# Patient Record
Sex: Male | Born: 1975 | Race: Black or African American | Hispanic: No | Smoking: Light tobacco smoker
Health system: Southern US, Community
[De-identification: ages and names within clinical notes are randomized; demographics above are authoritative.]

## PROBLEM LIST (undated history)

## (undated) DIAGNOSIS — I10 Essential (primary) hypertension: Secondary | ICD-10-CM

## (undated) HISTORY — PX: HERNIA REPAIR: SHX51

---

## 2010-05-26 ENCOUNTER — Emergency Department (HOSPITAL_COMMUNITY): Admission: EM | Admit: 2010-05-26 | Discharge: 2010-05-26 | Payer: Self-pay | Admitting: Emergency Medicine

## 2013-08-09 ENCOUNTER — Encounter (HOSPITAL_COMMUNITY): Payer: Self-pay | Admitting: Emergency Medicine

## 2013-08-09 ENCOUNTER — Emergency Department (HOSPITAL_COMMUNITY)
Admission: EM | Admit: 2013-08-09 | Discharge: 2013-08-09 | Disposition: A | Discharge status: Home / Self Care | Payer: Non-veteran care | Attending: Emergency Medicine | Admitting: Emergency Medicine

## 2013-08-09 ENCOUNTER — Emergency Department (HOSPITAL_COMMUNITY): Payer: Non-veteran care

## 2013-08-09 DIAGNOSIS — W2209XA Striking against other stationary object, initial encounter: Secondary | ICD-10-CM | POA: Insufficient documentation

## 2013-08-09 DIAGNOSIS — F17200 Nicotine dependence, unspecified, uncomplicated: Secondary | ICD-10-CM | POA: Insufficient documentation

## 2013-08-09 DIAGNOSIS — I10 Essential (primary) hypertension: Secondary | ICD-10-CM | POA: Insufficient documentation

## 2013-08-09 DIAGNOSIS — Y9389 Activity, other specified: Secondary | ICD-10-CM | POA: Insufficient documentation

## 2013-08-09 DIAGNOSIS — S62309A Unspecified fracture of unspecified metacarpal bone, initial encounter for closed fracture: Secondary | ICD-10-CM

## 2013-08-09 DIAGNOSIS — Y929 Unspecified place or not applicable: Secondary | ICD-10-CM | POA: Insufficient documentation

## 2013-08-09 HISTORY — DX: Essential (primary) hypertension: I10

## 2013-08-09 MED ORDER — HYDROMORPHONE HCL PF 1 MG/ML IJ SOLN
1.0000 mg | Freq: Once | INTRAMUSCULAR | Status: AC
  Administered 2013-08-09: 1 mg via INTRAMUSCULAR
  Filled 2013-08-09: qty 1

## 2013-08-09 MED ORDER — HYDROCODONE-ACETAMINOPHEN 5-325 MG PO TABS: 1.0000 | ORAL_TABLET | ORAL | Status: AC | PRN

## 2013-08-09 NOTE — ED Provider Notes (Signed)
CSN: [id number]     Arrival date & time 08/09/13  1253 History   First MD Initiated Contact with Patient 08/09/13 1541     Chief Complaint  Patient presents with  . Hand Injury   (Consider location/radiation/quality/duration/timing/severity/associated sxs/prior Treatment) HPI Comments: John Ross is a 37 y.o. Male presenting with pain and deformity of the 4th and 5th mcp joints after punching a wall early this morning.  He denies numbness in the fingers and is able to move them freely with moderate discomfort.  He has taken no medicines prior to arrival, but has kept it elevated without significant improvement.  He denies other injury including no forearm or elbow pain.     The history is provided by the patient.    Past Medical History  Diagnosis Date  . Hypertension    Past Surgical History  Procedure Laterality Date  . Hernia repair     History reviewed. No pertinent family history. History  Substance Use Topics  . Smoking status: Current Every Day Smoker  . Smokeless tobacco: Not on file  . Alcohol Use: Yes    Review of Systems  Constitutional: Negative for fever.  Musculoskeletal: Positive for arthralgias and joint swelling. Negative for myalgias.  Neurological: Negative for weakness and numbness.    Allergies  Review of patient's allergies indicates no known allergies.  Home Medications   Current Outpatient Rx  Name  Route  Sig  Dispense  Refill  . HYDROcodone-acetaminophen (NORCO/VICODIN) 5-325 MG per tablet   Oral   Take 1 tablet by mouth every 4 (four) hours as needed for moderate pain.   20 tablet   0    BP 175/108  Pulse 79  Temp(Src) 98.4 F (36.9 C) (Oral)  Resp 20  Ht 5\' 7"  (1.702 m)  Wt 179 lb (81.194 kg)  BMI 28.03 kg/m2  SpO2 100% Physical Exam  Constitutional: He appears well-developed and well-nourished.  HENT:  Head: Atraumatic.  Neck: Normal range of motion.  Cardiovascular:  Pulses equal bilaterally  Musculoskeletal: He  exhibits tenderness.       Hands: Ttp,  Deformity present at his fourth and fifth right MCP joints.  Distal sensation is intact, he is less than 3 second cap refill in his fingers.  Radial pulse full.  No forearm or elbow pain.  Neurological: He is alert. He has normal strength. He displays normal reflexes. No sensory deficit.  Equal strength  Skin: Skin is warm and dry.  Psychiatric: He has a normal mood and affect.    ED Course  SPLINT APPLICATION Date/Time: 08/09/2013 4:56 PM Performed by: Burgess Amor Authorized by: Burgess Amor Consent: Verbal consent obtained. Risks and benefits: risks, benefits and alternatives were discussed Consent given by: patient Patient understanding: patient states understanding of the procedure being performed Patient identity confirmed: verbally with patient Location details: right wrist Splint type: ulnar gutter Supplies used: cotton padding and Ortho-Glass Post-procedure: The splinted body part was neurovascularly unchanged following the procedure. Patient tolerance: Patient tolerated the procedure well with no immediate complications.   (including critical care time) Labs Review Labs Reviewed - No data to display Imaging Review Dg Hand Complete Right  08/09/2013   CLINICAL DATA:  Trauma.  EXAM: RIGHT HAND - COMPLETE 3+ VIEW  COMPARISON:  None.  FINDINGS: Angulated comminuted fractures of the distal metaphysis of the right 4th and 5th metacarpals noted. No radiopaque foreign bodies. Prominent soft tissue swelling.  IMPRESSION: Angulated comminuted fractures of the distal metaphysis of the right  4th and 5th metacarpals.   Electronically Signed   By: Maisie Fus  Register   On: 08/09/2013 14:13    EKG Interpretation   None       MDM   1. Metacarpal bone fracture, closed, initial encounter    Patient was referred to Dr. Romeo Apple for further management.  Encouraged ice, elevation.  Prescribed hydrocodone for pain relief.    Burgess Amor,  PA-C 08/09/13 [phone]

## 2013-08-09 NOTE — ED Notes (Addendum)
Rt hand injury ,struck wall with fist. Out of bp pills for 5 days

## 2013-08-10 NOTE — ED Provider Notes (Signed)
Medical screening examination/treatment/procedure(s) were performed by non-physician practitioner and as supervising physician I was immediately available for consultation/collaboration.  EKG Interpretation   None       Devoria Albe, MD, Armando Gang   Ward Givens, MD 08/10/13 [phone]

## 2015-11-14 ENCOUNTER — Emergency Department (HOSPITAL_COMMUNITY)
Admission: EM | Admit: 2015-11-14 | Discharge: 2015-11-14 | Disposition: A | Discharge status: Home / Self Care | Payer: Non-veteran care | Attending: Emergency Medicine | Admitting: Emergency Medicine

## 2015-11-14 ENCOUNTER — Encounter (HOSPITAL_COMMUNITY): Payer: Self-pay | Admitting: Emergency Medicine

## 2015-11-14 ENCOUNTER — Emergency Department (HOSPITAL_COMMUNITY): Payer: Non-veteran care

## 2015-11-14 DIAGNOSIS — S99921A Unspecified injury of right foot, initial encounter: Secondary | ICD-10-CM | POA: Diagnosis present

## 2015-11-14 DIAGNOSIS — S99911A Unspecified injury of right ankle, initial encounter: Secondary | ICD-10-CM | POA: Insufficient documentation

## 2015-11-14 DIAGNOSIS — Y9289 Other specified places as the place of occurrence of the external cause: Secondary | ICD-10-CM | POA: Insufficient documentation

## 2015-11-14 DIAGNOSIS — Y9389 Activity, other specified: Secondary | ICD-10-CM | POA: Diagnosis not present

## 2015-11-14 DIAGNOSIS — F17200 Nicotine dependence, unspecified, uncomplicated: Secondary | ICD-10-CM | POA: Insufficient documentation

## 2015-11-14 DIAGNOSIS — Y998 Other external cause status: Secondary | ICD-10-CM | POA: Insufficient documentation

## 2015-11-14 DIAGNOSIS — S92411A Displaced fracture of proximal phalanx of right great toe, initial encounter for closed fracture: Secondary | ICD-10-CM | POA: Insufficient documentation

## 2015-11-14 DIAGNOSIS — I10 Essential (primary) hypertension: Secondary | ICD-10-CM | POA: Insufficient documentation

## 2015-11-14 DIAGNOSIS — W208XXA Other cause of strike by thrown, projected or falling object, initial encounter: Secondary | ICD-10-CM | POA: Diagnosis not present

## 2015-11-14 DIAGNOSIS — S92401A Displaced unspecified fracture of right great toe, initial encounter for closed fracture: Secondary | ICD-10-CM

## 2015-11-14 MED ORDER — OXYCODONE-ACETAMINOPHEN 5-325 MG PO TABS: 1.0000 | ORAL_TABLET | Freq: Three times a day (TID) | ORAL | Status: AC | PRN

## 2015-11-14 MED ORDER — HYDROCHLOROTHIAZIDE 12.5 MG PO CAPS
12.5000 mg | ORAL_CAPSULE | Freq: Every day | ORAL | Status: DC
  Administered 2015-11-14: 12.5 mg via ORAL
  Filled 2015-11-14: qty 1

## 2015-11-14 NOTE — Discharge Instructions (Signed)
Please read and follow all provided instructions.  Your diagnoses today include:  1. Fracture of great toe, right, closed, initial encounter    Tests performed today include:  Vital signs. See below for your results today.   Medications prescribed:   Percocet You have been prescribed a narcotic medication on an "as needed" basis. Take only as prescribed. Do not drive, operate any machinery or make any important decisions while taking this medication as it is sedating. It may cause constipation take over the counter stool softeners or add fiber to your diet to treat this (Metamucil, Psyllium Fiber, Colace, Miralax) Further refills will need to be obtained from your primary care doctor and will not be prescribed through the Emergency Department. You will test positive on most drug tests while taking this medciation.   Home care instructions:  Follow any educational materials contained in this packet.  Follow-up instructions: Please follow-up with Dr. Eulah Pont (Orhtopedics) Call and Make an Appointment as soon as you can  Return instructions:   Please return to the Emergency Department if you do not get better, if you get worse, or new symptoms OR  - Fever (temperature greater than 101.9F)  - Bleeding that does not stop with holding pressure to the area    -Severe pain (please note that you may be more sore the day after your accident)  - Chest Pain  - Difficulty breathing  - Severe nausea or vomiting  - Inability to tolerate food and liquids  - Passing out  - Skin becoming red around your wounds  - Change in mental status (confusion or lethargy)  - New numbness or weakness     Please return if you have any other emergent concerns.  Additional Information:  Your vital signs today were: BP 176/116 mmHg   Pulse 83   Temp(Src) 98.6 F (37 C) (Oral)   Resp 14   Ht  (1.702 m)   Wt 81.194 kg   BMI 28.03 kg/m2   SpO2 99% If your blood pressure (BP) was elevated above 135/85 this  visit, please have this repeated by your doctor within one month. ---------------

## 2015-11-14 NOTE — ED Notes (Signed)
Pt left with all his belongings and ambulated out of the treatment area.  

## 2015-11-14 NOTE — ED Notes (Signed)
Onset today while at work unhooked a trailer by using a jack gave out and the pallet filled roofing shingles fell onto right foot. Took two guys to life pallet to remove right foot. Pain 8/10 upon EMS arrival 8/10 sharp pedal pulse marked by EMS. Morphine  IVP left AC. Pain currently 2/10 sore full sensation states bilateral sensation bilateral feet.

## 2015-11-14 NOTE — ED Provider Notes (Signed)
CSN: [id number]     Arrival date & time 11/14/15  1646 History   First MD Initiated Contact with Patient 11/14/15 1705     Chief Complaint  Patient presents with  . Foot Injury   (Consider location/radiation/quality/duration/timing/severity/associated sxs/prior Treatment) HPI 40 y.o. male with a hx of HTN, presents to the Emergency Department today due to right foot pain. States that he was moving shingles from a trailer when the mount unhooked and fell on top of his foot. He was facing away from the trailer with his toes fully extended and leg bent. The trailer weight pushed down from the top of his calcaneous downward. He was pinned for 4-5 minutes. Reports initial pain of 8/10 and sharp. Notified EMS who gave  morphine which reduced the pain to 2/10. Feels sore currently. Unable to ambulate. No N/V/D. No CP/SOB/ABD pain. No numbness/tingling.   Past Medical History  Diagnosis Date  . Hypertension    History reviewed. No pertinent past surgical history. No family history on file. Social History  Substance Use Topics  . Smoking status: Light Tobacco Smoker  . Smokeless tobacco: None  . Alcohol Use: Yes    Review of Systems ROS reviewed and all are negative for acute change except as noted in the HPI.  Allergies  Review of patient's allergies indicates no known allergies.  Home Medications   Prior to Admission medications   Not on File   BP 171/109 mmHg  Pulse 68  Temp(Src) 98.6 F (37 C) (Oral)  Resp 16  Ht  (1.702 m)  Wt 81.194 kg  BMI 28.03 kg/m2  SpO2 97%   Physical Exam  Constitutional: He is oriented to person, place, and time. He appears well-developed and well-nourished.  HENT:  Head: Normocephalic and atraumatic.  Eyes: EOM are normal. Pupils are equal, round, and reactive to light.  Neck: Normal range of motion. Neck supple.  Cardiovascular: Normal rate and regular rhythm.   Pulmonary/Chest: Effort normal.  Abdominal: Soft.  Musculoskeletal:       Right ankle: He exhibits decreased range of motion. He exhibits no swelling, no ecchymosis, no deformity and no laceration.  TTP on right dorsum of foot and phalanx of great toe. Passive ROM intact. Distal pulses palpable. Cap refill <2sec. No ecchymosis. Minimal swelling noted.   Neurological: He is alert and oriented to person, place, and time.  Skin: Skin is warm and dry.  Psychiatric: He has a normal mood and affect. His behavior is normal. Thought content normal.  Nursing note and vitals reviewed.  ED Course  Procedures (including critical care time) Labs Review Labs Reviewed - No data to display  Imaging Review Dg Ankle Complete Right  11/14/2015  CLINICAL DATA:  40 year old male status post injury to plantar aspect of the foot secondary to a trailer hitch. EXAM: RIGHT ANKLE - COMPLETE 3+ VIEW COMPARISON:  Concurrently obtained radiographs of the right foot FINDINGS: There is no evidence of fracture, dislocation, or joint effusion. There is no evidence of arthropathy or other focal bone abnormality. Soft tissues are unremarkable. IMPRESSION: Negative. Electronically Signed   By: Malachy Moan M.D.   On: 11/14/2015 18:09   Dg Foot Complete Right  11/14/2015  CLINICAL DATA:  Injury EXAM: RIGHT FOOT COMPLETE - 3+ VIEW COMPARISON:  None. FINDINGS: Small fracture fragment adjacent to the medial malleolus has a chronic appearance. There is a minimally displaced intra-articular fracture involving the distal aspect of the proximal phalanx of the great toe. Bony framework is otherwise  intact. IMPRESSION: Acute intra-articular fracture of the proximal phalanx of the great toe. Electronically Signed   By: Jolaine Click M.D.   On: 11/14/2015 18:11   I have personally reviewed and evaluated these images and lab results as part of my medical decision-making.   EKG Interpretation None      MDM  I have reviewed relevant imaging studies. I have reviewed the relevant previous healthcare  records. I obtained HPI from historian. Patient discussed with supervising physician  ED Course:  Assessment: 15y M with hx HTN presents with right foot trauma from trailer pinning his foot for 4-5 minutes. On exam, distal pulses intact. Motor/sensory intact. Cap refill <2sec. Pain with plantar/dorsi flexion. TTP dorsum of tarsals and great toe. XR showed Acute intra-articular fracture of the proximal phalanx of the great toe. Consult with Orthopedics wished to put in Ortho boot/crutches and follow up outpatient. Will DC with follow up to Dr. Eulah Pont. Given pain medication.    Disposition/Plan:  DC Home Additional Verbal discharge instructions given and discussed with patient.  Pt Instructed to f/u with Ortho Dr. Eulah Pont Return precautions given Pt acknowledges and agrees with plan  Supervising Physician Mancel Bale, MD   Final diagnoses:  Fracture of great toe, right, closed, initial encounter      Audry Pili, PA-C 11/14/15 1843  Mancel Bale, MD 11/15/15 [phone]

## 2015-11-15 ENCOUNTER — Encounter (HOSPITAL_COMMUNITY): Payer: Self-pay | Admitting: Emergency Medicine

## 2015-12-22 ENCOUNTER — Encounter: Payer: Self-pay | Admitting: *Deleted

## 2017-03-04 IMAGING — DX DG ANKLE COMPLETE 3+V*R*
3 series · 3 of 3 positions shown · non-contrast
Comparison: Concurrently obtained radiographs of the right foot

CLINICAL DATA: 39-year-old male status post injury to plantar
aspect of the foot secondary to a trailer hitch.

EXAM:
RIGHT ANKLE - COMPLETE 3+ VIEW

[x ankle ap right]
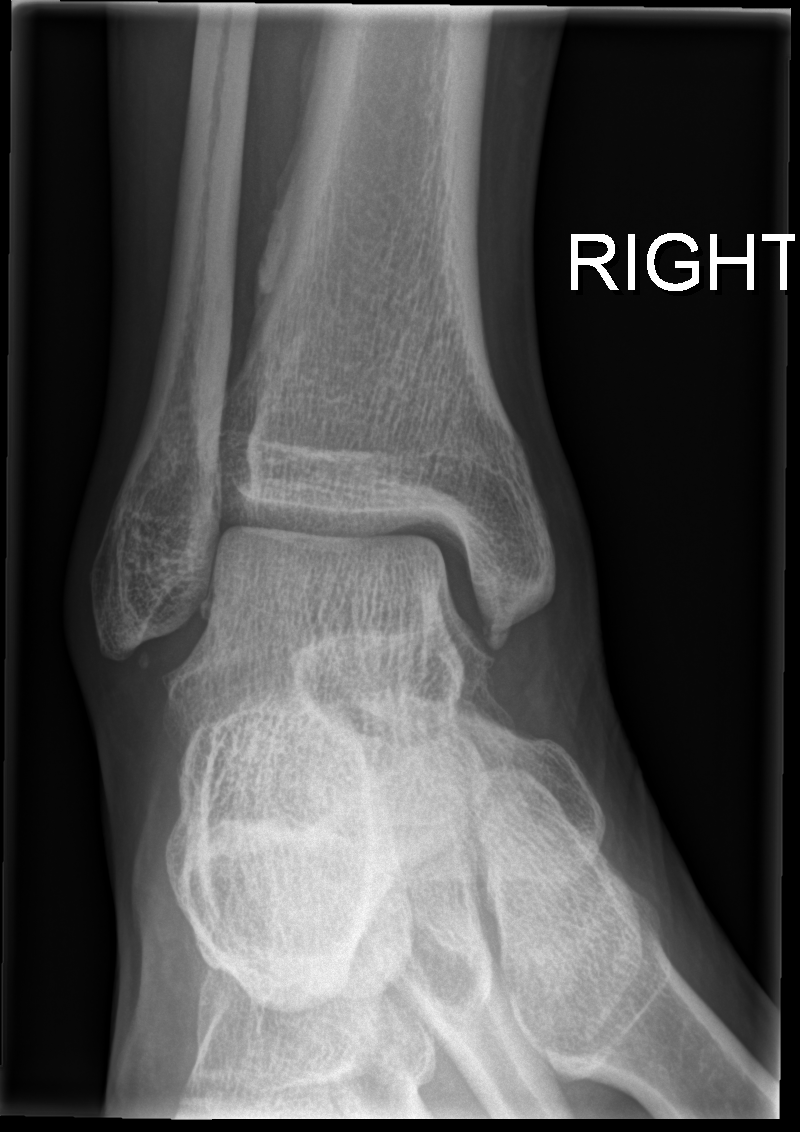

[x ankle obl right]
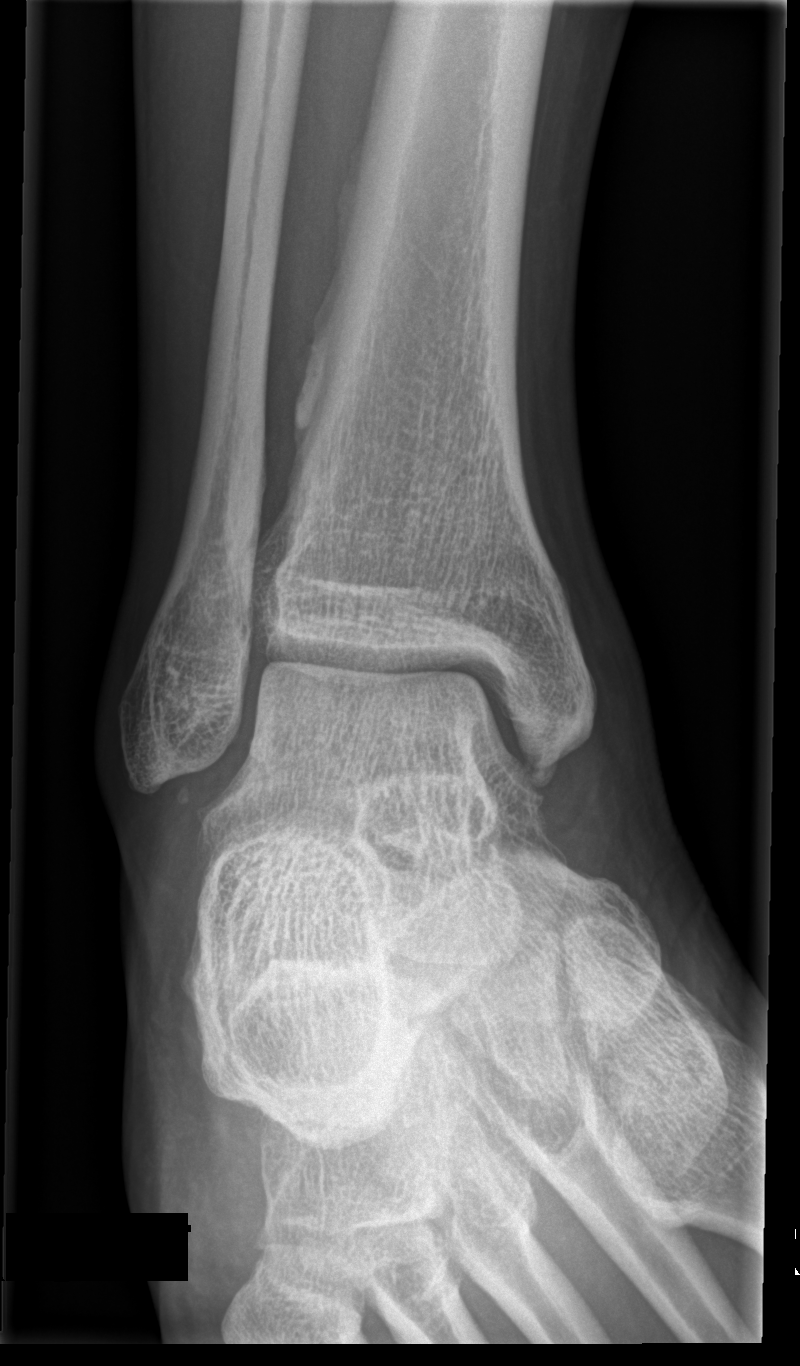

[x ankle lat right]
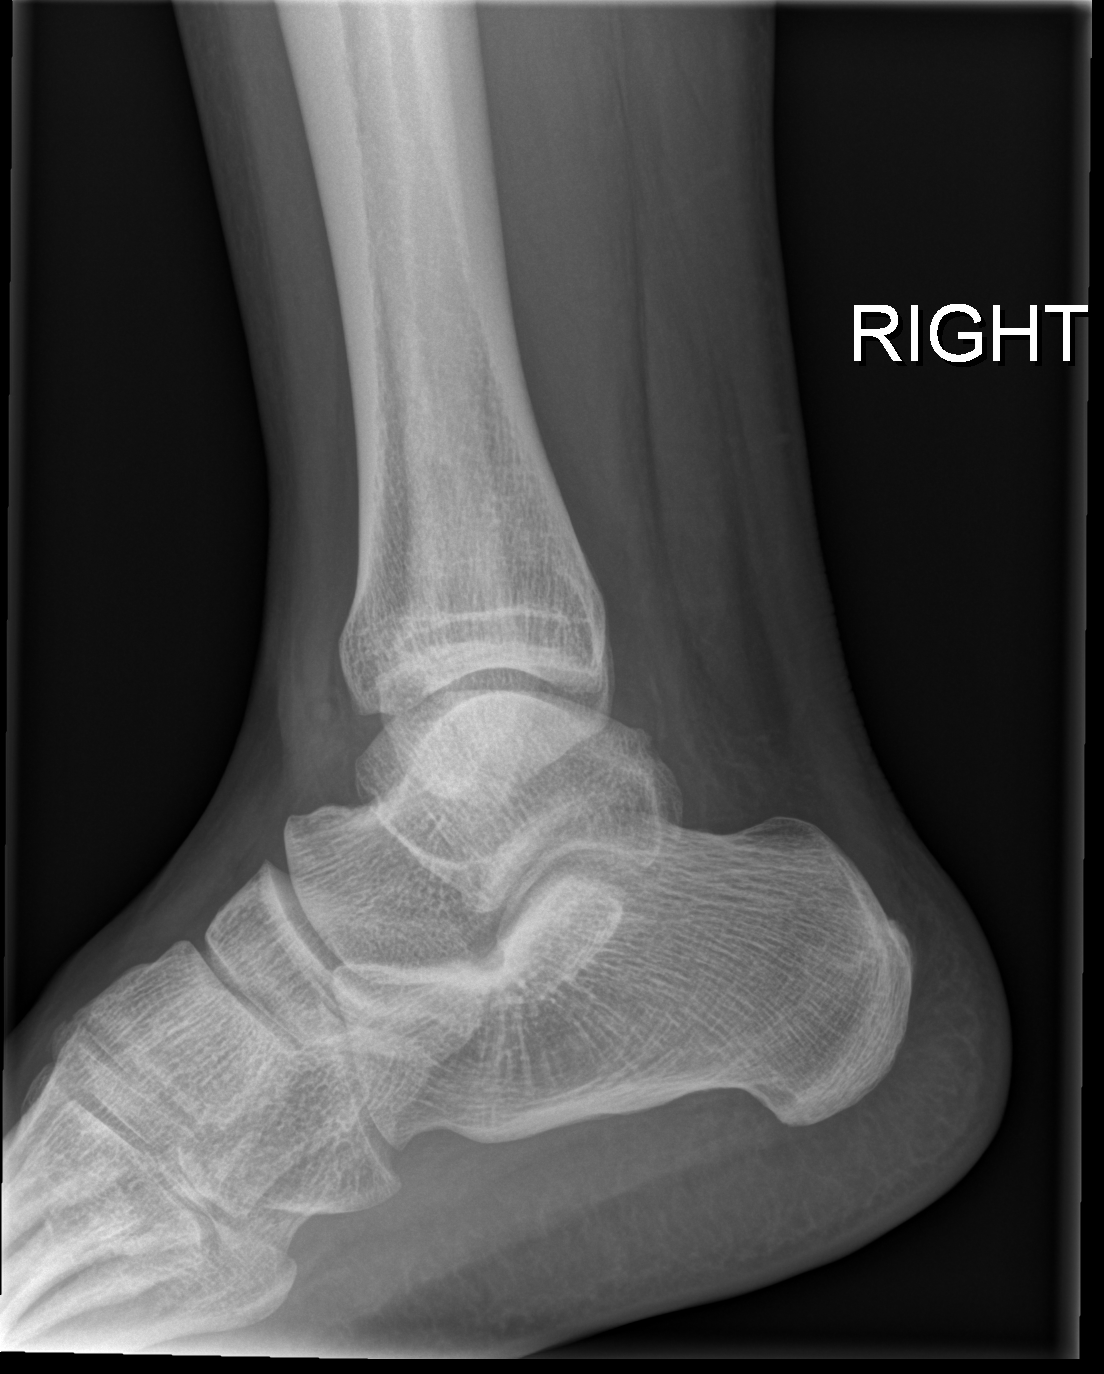

[3 of 3 positions shown; findings below may reference images not displayed]

FINDINGS: There is no evidence of fracture, dislocation, or joint effusion.
There is no evidence of arthropathy or other focal bone abnormality.
Soft tissues are unremarkable.
IMPRESSION: Negative.

## 2021-05-11 ENCOUNTER — Emergency Department (HOSPITAL_COMMUNITY): Payer: No Typology Code available for payment source

## 2021-05-11 ENCOUNTER — Other Ambulatory Visit: Payer: Self-pay

## 2021-05-11 ENCOUNTER — Emergency Department (HOSPITAL_COMMUNITY)
Admission: EM | Admit: 2021-05-11 | Discharge: 2021-05-11 | Disposition: A | Discharge status: Home / Self Care | Payer: No Typology Code available for payment source | Attending: Emergency Medicine | Admitting: Emergency Medicine

## 2021-05-11 DIAGNOSIS — Z79899 Other long term (current) drug therapy: Secondary | ICD-10-CM | POA: Insufficient documentation

## 2021-05-11 DIAGNOSIS — S39012A Strain of muscle, fascia and tendon of lower back, initial encounter: Secondary | ICD-10-CM | POA: Insufficient documentation

## 2021-05-11 DIAGNOSIS — S3992XA Unspecified injury of lower back, initial encounter: Secondary | ICD-10-CM | POA: Diagnosis present

## 2021-05-11 DIAGNOSIS — X58XXXA Exposure to other specified factors, initial encounter: Secondary | ICD-10-CM | POA: Insufficient documentation

## 2021-05-11 DIAGNOSIS — I10 Essential (primary) hypertension: Secondary | ICD-10-CM | POA: Diagnosis not present

## 2021-05-11 DIAGNOSIS — F17200 Nicotine dependence, unspecified, uncomplicated: Secondary | ICD-10-CM | POA: Insufficient documentation

## 2021-05-11 LAB — COMPREHENSIVE METABOLIC PANEL
ALT: 27 U/L (ref 0–44)
AST: 24 U/L (ref 15–41)
Albumin: 4.7 g/dL (ref 3.5–5.0)
Alkaline Phosphatase: 98 U/L (ref 38–126)
Anion gap: 8 (ref 5–15)
BUN: 9 mg/dL (ref 6–20)
CO2: 28 mmol/L (ref 22–32)
Calcium: 9.9 mg/dL (ref 8.9–10.3)
Chloride: 103 mmol/L (ref 98–111)
Creatinine, Ser: 0.94 mg/dL (ref 0.61–1.24)
GFR, Estimated: >60 mL/min (ref >60)
Glucose, Bld: 132 mg/dL — ABNORMAL HIGH (ref 70–99)
Potassium: 3.2 mmol/L — ABNORMAL LOW (ref 3.5–5.1)
Sodium: 139 mmol/L (ref 135–145)
Total Bilirubin: 0.9 mg/dL (ref 0.3–1.2)
Total Protein: 8.9 g/dL — ABNORMAL HIGH (ref 6.5–8.1)

## 2021-05-11 LAB — CBC WITH DIFFERENTIAL/PLATELET
Abs Immature Granulocytes: 0.1 10*3/uL — ABNORMAL HIGH (ref 0.00–0.07)
Basophils Absolute: 0 10*3/uL (ref 0.0–0.1)
Basophils Relative: 0 %
Eosinophils Absolute: 0 10*3/uL (ref 0.0–0.5)
Eosinophils Relative: 0 %
HCT: 43.3 % (ref 39.0–52.0)
Hemoglobin: 14.6 g/dL (ref 13.0–17.0)
Immature Granulocytes: 1 %
Lymphocytes Relative: 3 %
Lymphs Abs: 0.3 10*3/uL — ABNORMAL LOW (ref 0.7–4.0)
MCH: 29.6 pg (ref 26.0–34.0)
MCHC: 33.7 g/dL (ref 30.0–36.0)
MCV: 87.8 fL (ref 80.0–100.0)
Monocytes Absolute: 1.3 10*3/uL — ABNORMAL HIGH (ref 0.1–1.0)
Monocytes Relative: 11 %
Neutro Abs: 10.2 10*3/uL — ABNORMAL HIGH (ref 1.7–7.7)
Neutrophils Relative %: 85 %
Platelets: 235 10*3/uL (ref 150–400)
RBC: 4.93 MIL/uL (ref 4.22–5.81)
RDW: 13.3 % (ref 11.5–15.5)
WBC: 12 10*3/uL — ABNORMAL HIGH (ref 4.0–10.5)
nRBC: 0 % (ref 0.0–0.2)

## 2021-05-11 MED ORDER — LABETALOL HCL 5 MG/ML IV SOLN
20.0000 mg | Freq: Once | INTRAVENOUS | Status: AC
  Administered 2021-05-11: 20 mg via INTRAVENOUS
  Filled 2021-05-11: qty 4

## 2021-05-11 MED ORDER — LISINOPRIL 20 MG PO TABS: 20.0000 mg | ORAL_TABLET | Freq: Every day | ORAL | 1 refills | Status: AC

## 2021-05-11 MED ORDER — HYDROMORPHONE HCL 1 MG/ML IJ SOLN
1.0000 mg | Freq: Once | INTRAMUSCULAR | Status: AC
  Administered 2021-05-11: 1 mg via INTRAMUSCULAR
  Filled 2021-05-11: qty 1

## 2021-05-11 MED ORDER — TRAMADOL HCL 50 MG PO TABS
50.0000 mg | ORAL_TABLET | Freq: Four times a day (QID) | ORAL | 0 refills | Status: AC | PRN
Start: 2021-05-11 — End: ?

## 2021-05-11 NOTE — ED Notes (Signed)
Pt reports being aware of elevated blood pressure but has not gotten his rx filled from [place] in about a year. Educated pt on importance of taking bp meds and risks involved with high bp. Pt verbalized understanding. States he previously took Hydrochlorothiazide. No headache at this this time, but states he has had a lot of headaches recently. No visual changed noted. Labetolol given for bp elevation. Placed on cardiac monitor. Pt comfortable at this time. Will continue to monitor.

## 2021-05-11 NOTE — ED Triage Notes (Signed)
Lower back pain into legs started yesterday, skin warm and dry, ambulatory, pain 9/10

## 2021-05-11 NOTE — Discharge Instructions (Addendum)
Follow-up with your family doctor in the next couple weeks for your back pain and blood pressure.  He can also follow-up with the cardiologist in the next 2 to 3 weeks for your blood pressure and your the changes seen on your heart tracing

## 2021-05-11 NOTE — ED Provider Notes (Signed)
[facility] EMERGENCY DEPARTMENT Provider Note   CSN: [id number] Arrival date & time: 05/11/21  0034     History Chief Complaint  Patient presents with   Back Pain    John Ross is a 45 y.o. male.  Patient complains of low back pain.  The pain is worse with movement.  The history is provided by the patient and medical records. No language interpreter was used.  Back Pain Location:  Lumbar spine Quality:  Aching Radiates to:  Does not radiate Pain severity:  Moderate Pain is:  Same all the time Onset quality:  Sudden Duration:  4 days Timing:  Constant Progression:  Worsening Chronicity:  New Relieved by:  Nothing Worsened by:  Movement Associated symptoms: no abdominal pain, no chest pain and no headaches       Past Medical History:  Diagnosis Date   Hypertension     There are no problems to display for this patient.   Past Surgical History:  Procedure Laterality Date   HERNIA REPAIR         No family history on file.  Social History   Tobacco Use   Smoking status: Light Smoker  Substance Use Topics   Alcohol use: Yes   Drug use: Yes    Types: Marijuana    Home Medications Prior to Admission medications   Medication Sig Start Date End Date Taking? Authorizing Provider  lisinopril (ZESTRIL) 20 MG tablet Take 1 tablet (20 mg total) by mouth daily. 05/11/21  Yes Bethann Berkshire, MD  traMADol (ULTRAM) 50 MG tablet Take 1 tablet (50 mg total) by mouth every 6 (six) hours as needed. 05/11/21  Yes Bethann Berkshire, MD  HYDROcodone-acetaminophen (NORCO/VICODIN) 5-325 MG per tablet Take 1 tablet by mouth every 4 (four) hours as needed for moderate pain. Patient not taking: Reported on 05/11/2021 08/09/13   Burgess Amor, PA-C  oxyCODONE-acetaminophen (PERCOCET/ROXICET) 5-325 MG tablet Take 1 tablet by mouth every 8 (eight) hours as needed for severe pain. Patient not taking: Reported on 05/11/2021 11/14/15   Audry Pili, PA-C    Allergies    Patient has  no known allergies.  Review of Systems   Review of Systems  Constitutional:  Negative for appetite change and fatigue.  HENT:  Negative for congestion, ear discharge and sinus pressure.   Eyes:  Negative for discharge.  Respiratory:  Negative for cough.   Cardiovascular:  Negative for chest pain.  Gastrointestinal:  Negative for abdominal pain and diarrhea.  Genitourinary:  Negative for frequency and hematuria.  Musculoskeletal:  Positive for back pain.  Skin:  Negative for rash.  Neurological:  Negative for seizures and headaches.  Psychiatric/Behavioral:  Negative for hallucinations.    Physical Exam Updated Vital Signs BP (!) 185/110   Pulse 85   Temp (!) 97.1 F (36.2 C) (Tympanic)   Resp (!) 29   Ht 5\' 7"  (1.702 m)   Wt 83.9 kg   SpO2 96%   BMI 28.98 kg/m   Physical Exam Vitals and nursing note reviewed.  Constitutional:      Appearance: He is well-developed.  HENT:     Head: Normocephalic.     Nose: Nose normal.  Eyes:     General: No scleral icterus.    Conjunctiva/sclera: Conjunctivae normal.  Neck:     Thyroid: No thyromegaly.  Cardiovascular:     Rate and Rhythm: Normal rate and regular rhythm.     Heart sounds: No murmur heard.   No friction rub. No  gallop.  Pulmonary:     Breath sounds: No stridor. No wheezing or rales.  Chest:     Chest wall: No tenderness.  Abdominal:     General: There is no distension.     Tenderness: There is no abdominal tenderness. There is no rebound.  Musculoskeletal:        General: Normal range of motion.     Cervical back: Neck supple.     Comments: Tenderness to left lumbar muscles  Lymphadenopathy:     Cervical: No cervical adenopathy.  Skin:    Findings: No erythema or rash.  Neurological:     Mental Status: He is alert and oriented to person, place, and time.     Motor: No abnormal muscle tone.     Coordination: Coordination normal.  Psychiatric:        Behavior: Behavior normal.    ED Results /  Procedures / Treatments   Labs (all labs ordered are listed, but only abnormal results are displayed) Labs Reviewed  CBC WITH DIFFERENTIAL/PLATELET - Abnormal; Notable for the following components:      Result Value   WBC 12.0 (*)    Neutro Abs 10.2 (*)    Lymphs Abs 0.3 (*)    Monocytes Absolute 1.3 (*)    Abs Immature Granulocytes 0.10 (*)    All other components within normal limits  COMPREHENSIVE METABOLIC PANEL - Abnormal; Notable for the following components:   Potassium 3.2 (*)    Glucose, Bld 132 (*)    Total Protein 8.9 (*)    All other components within normal limits    EKG None  Radiology DG Lumbar Spine Complete  Result Date: 05/11/2021 CLINICAL DATA:  45 year old male with low back pain radiating to both legs since yesterday. Possible twisting injury. EXAM: LUMBAR SPINE - COMPLETE 4+ VIEW COMPARISON:  None. FINDINGS: Normal lumbar segmentation. Bone mineralization is within normal limits. Normal vertebral height and lumbar lordosis. No pars fracture. No acute osseous abnormality identified. Visible sacrum and SI joints appear intact. Lower thoracic levels appear intact. Relatively preserved disc spaces. Mild posterior disc space loss at L5-S1. Minimal lower lumbar endplate spurring. Negative visible abdominal visceral contours. IMPRESSION: 1. No acute osseous abnormality identified in the lumbar spine. 2. Mild disc space loss at L5-S1. Electronically Signed   By: Odessa Fleming M.D.   On: 05/11/2021 08:32   DG Chest Port 1 View  Result Date: 05/11/2021 CLINICAL DATA:  Chest pain EXAM: PORTABLE CHEST 1 VIEW COMPARISON:  None. FINDINGS: 0926 hours. Asymmetric elevation right hemidiaphragm. The lungs are clear without focal pneumonia, edema, pneumothorax or pleural effusion. The cardiopericardial silhouette is within normal limits for size. The visualized bony structures of the thorax show no acute abnormality. IMPRESSION: No active disease. Electronically Signed   By: Kennith Center  M.D.   On: 05/11/2021 09:52    Procedures Procedures   Medications Ordered in ED Medications  HYDROmorphone (DILAUDID) injection 1 mg (1 mg Intramuscular Given 05/11/21 0844)  labetalol (NORMODYNE) injection 20 mg (20 mg Intravenous Given 05/11/21 1022)    ED Course  I have reviewed the triage vital signs and the nursing notes.  Pertinent labs & imaging results that were available during my care of the patient were reviewed by me and considered in my medical decision making (see chart for details). Patient has elevated blood pressure.  Patient has no history of hypertension but does not get blood pressure check.  Labs are unremarkable EKG did show poor R wave  progression with some nonspecific ST changes chest x-ray negative lumbar spines negative   MDM Rules/Calculators/A&P                           Patient with lumbar muscle strain and some degenerative changes of the spine.  He is given Ultram for that pain.  Also patient with hypertension he will be started on lisinopril and has been referred to cardiology for his nonspecific ST-T wave changes and also referred to a family doctor Final Clinical Impression(s) / ED Diagnoses Final diagnoses:  Strain of lumbar region, initial encounter  Primary hypertension    Rx / DC Orders ED Discharge Orders          Ordered    lisinopril (ZESTRIL) 20 MG tablet  Daily        05/11/21 1039    traMADol (ULTRAM) 50 MG tablet  Every 6 hours PRN        05/11/21 1039             Bethann Berkshire, MD 05/11/21 1046

## 2022-08-11 ENCOUNTER — Emergency Department (HOSPITAL_COMMUNITY): Payer: No Typology Code available for payment source

## 2022-08-11 ENCOUNTER — Encounter (HOSPITAL_COMMUNITY): Payer: Self-pay | Admitting: *Deleted

## 2022-08-11 ENCOUNTER — Emergency Department (HOSPITAL_COMMUNITY)
Admission: EM | Admit: 2022-08-11 | Discharge: 2022-08-12 | Disposition: A | Discharge status: Home / Self Care | Payer: No Typology Code available for payment source | Attending: Emergency Medicine | Admitting: Emergency Medicine

## 2022-08-11 ENCOUNTER — Other Ambulatory Visit: Payer: Self-pay

## 2022-08-11 DIAGNOSIS — J36 Peritonsillar abscess: Secondary | ICD-10-CM | POA: Insufficient documentation

## 2022-08-11 DIAGNOSIS — Z79899 Other long term (current) drug therapy: Secondary | ICD-10-CM | POA: Insufficient documentation

## 2022-08-11 DIAGNOSIS — M542 Cervicalgia: Secondary | ICD-10-CM | POA: Insufficient documentation

## 2022-08-11 DIAGNOSIS — R000 Tachycardia, unspecified: Secondary | ICD-10-CM | POA: Insufficient documentation

## 2022-08-11 DIAGNOSIS — I10 Essential (primary) hypertension: Secondary | ICD-10-CM | POA: Diagnosis not present

## 2022-08-11 DIAGNOSIS — J029 Acute pharyngitis, unspecified: Secondary | ICD-10-CM | POA: Diagnosis present

## 2022-08-11 LAB — COMPREHENSIVE METABOLIC PANEL
ALT: 60 U/L — ABNORMAL HIGH (ref 0–44)
AST: 32 U/L (ref 15–41)
Albumin: 4.1 g/dL (ref 3.5–5.0)
Alkaline Phosphatase: 124 U/L (ref 38–126)
Anion gap: 9 (ref 5–15)
BUN: 9 mg/dL (ref 6–20)
CO2: 24 mmol/L (ref 22–32)
Calcium: 9.3 mg/dL (ref 8.9–10.3)
Chloride: 103 mmol/L (ref 98–111)
Creatinine, Ser: 0.81 mg/dL (ref 0.61–1.24)
GFR, Estimated: >60 mL/min (ref >60)
Glucose, Bld: 140 mg/dL — ABNORMAL HIGH (ref 70–99)
Potassium: 3 mmol/L — ABNORMAL LOW (ref 3.5–5.1)
Sodium: 136 mmol/L (ref 135–145)
Total Bilirubin: 1.1 mg/dL (ref 0.3–1.2)
Total Protein: 8.8 g/dL — ABNORMAL HIGH (ref 6.5–8.1)

## 2022-08-11 LAB — CBC WITH DIFFERENTIAL/PLATELET
Abs Immature Granulocytes: 0.05 10*3/uL (ref 0.00–0.07)
Basophils Absolute: 0.1 10*3/uL (ref 0.0–0.1)
Basophils Relative: 0 %
Eosinophils Absolute: 0 10*3/uL (ref 0.0–0.5)
Eosinophils Relative: 0 %
HCT: 40.9 % (ref 39.0–52.0)
Hemoglobin: 14.4 g/dL (ref 13.0–17.0)
Immature Granulocytes: 0 %
Lymphocytes Relative: 15 %
Lymphs Abs: 2.6 10*3/uL (ref 0.7–4.0)
MCH: 29.6 pg (ref 26.0–34.0)
MCHC: 35.2 g/dL (ref 30.0–36.0)
MCV: 84 fL (ref 80.0–100.0)
Monocytes Absolute: 1.8 10*3/uL — ABNORMAL HIGH (ref 0.1–1.0)
Monocytes Relative: 10 %
Neutro Abs: 13 10*3/uL — ABNORMAL HIGH (ref 1.7–7.7)
Neutrophils Relative %: 75 %
Platelets: 252 10*3/uL (ref 150–400)
RBC: 4.87 MIL/uL (ref 4.22–5.81)
RDW: 12.8 % (ref 11.5–15.5)
WBC: 17.5 10*3/uL — ABNORMAL HIGH (ref 4.0–10.5)
nRBC: 0 % (ref 0.0–0.2)

## 2022-08-11 LAB — GROUP A STREP BY PCR: Group A Strep by PCR: NOT DETECTED

## 2022-08-11 MED ORDER — POTASSIUM CHLORIDE 10 MEQ/100ML IV SOLN
10.0000 meq | Freq: Once | INTRAVENOUS | Status: AC
  Administered 2022-08-11: 10 meq via INTRAVENOUS
  Filled 2022-08-11: qty 100

## 2022-08-11 MED ORDER — LABETALOL HCL 5 MG/ML IV SOLN
20.0000 mg | Freq: Once | INTRAVENOUS | Status: AC
  Administered 2022-08-11: 20 mg via INTRAVENOUS
  Filled 2022-08-11: qty 4

## 2022-08-11 MED ORDER — SODIUM CHLORIDE 0.9 % IV BOLUS
1000.0000 mL | Freq: Once | INTRAVENOUS | Status: AC
  Administered 2022-08-11: 1000 mL via INTRAVENOUS

## 2022-08-11 MED ORDER — METHYLPREDNISOLONE SODIUM SUCC 125 MG IJ SOLR
125.0000 mg | Freq: Once | INTRAMUSCULAR | Status: AC
  Administered 2022-08-11: 125 mg via INTRAVENOUS
  Filled 2022-08-11: qty 2

## 2022-08-11 MED ORDER — MORPHINE SULFATE (PF) 4 MG/ML IV SOLN
4.0000 mg | Freq: Once | INTRAVENOUS | Status: AC
  Administered 2022-08-11: 4 mg via INTRAVENOUS
  Filled 2022-08-11: qty 1

## 2022-08-11 MED ORDER — ONDANSETRON HCL 4 MG/2ML IJ SOLN
4.0000 mg | Freq: Once | INTRAMUSCULAR | Status: AC
  Administered 2022-08-11: 4 mg via INTRAVENOUS
  Filled 2022-08-11: qty 2

## 2022-08-11 MED ORDER — SODIUM CHLORIDE 0.9 % IV SOLN
3.0000 g | Freq: Once | INTRAVENOUS | Status: AC
  Administered 2022-08-11: 3 g via INTRAVENOUS
  Filled 2022-08-11: qty 8

## 2022-08-11 MED ORDER — IOHEXOL 300 MG/ML  SOLN
75.0000 mL | Freq: Once | INTRAMUSCULAR | Status: AC | PRN
  Administered 2022-08-11: 75 mL via INTRAVENOUS

## 2022-08-11 NOTE — ED Provider Notes (Signed)
[facility] EMERGENCY DEPARTMENT Provider Note   CSN: [id number] Arrival date & time: 08/11/22  2116     History  Chief Complaint  Patient presents with   Sore Throat   HPI John Ross is a 46 y.o. male with hypertension presenting for sore throat. Started two days ago. Throat is sore along with right sided neck pain. Pain is all day long.  It feels like squeezing and grabbing.  Denies syncope or visual disturbance.  Denies fever.  States that he has had some trouble swallowing.  States he can barely tolerate eating soup or drinking fluids.  Has not eaten solid foods in the last couple days.  States that he feels like his right side of his neck is also swollen.   Sore Throat       Home Medications Prior to Admission medications   Medication Sig Start Date End Date Taking? Authorizing Provider  HYDROcodone-acetaminophen (NORCO/VICODIN) 5-325 MG per tablet Take 1 tablet by mouth every 4 (four) hours as needed for moderate pain. Patient not taking: Reported on 05/11/2021 08/09/13   Burgess Amor, PA-C  lisinopril (ZESTRIL) 20 MG tablet Take 1 tablet (20 mg total) by mouth daily. 05/11/21   Bethann Berkshire, MD  oxyCODONE-acetaminophen (PERCOCET/ROXICET) 5-325 MG tablet Take 1 tablet by mouth every 8 (eight) hours as needed for severe pain. Patient not taking: Reported on 05/11/2021 11/14/15   Audry Pili, PA-C  traMADol (ULTRAM) 50 MG tablet Take 1 tablet (50 mg total) by mouth every 6 (six) hours as needed. 05/11/21   Bethann Berkshire, MD      Allergies    Patient has no known allergies.    Review of Systems   Review of Systems  HENT:  Positive for sore throat.     Physical Exam Updated Vital Signs BP (!) 206/126   Pulse 100   Temp 98.6 F (37 C) (Oral)   Resp 18   Ht 5\' 7"  (1.702 m)   Wt 99.3 kg   SpO2 96%   BMI 34.30 kg/m  Physical Exam Vitals and nursing note reviewed.  HENT:     Head: Normocephalic and atraumatic.     Mouth/Throat:     Mouth: Mucous membranes  are moist.     Pharynx: Pharyngeal swelling, posterior oropharyngeal erythema and uvula swelling present.     Tonsils: Tonsillar abscess present.     Comments: Swelling noted on the right anterior aspect of the neck.   Eyes:     General:        Right eye: No discharge.        Left eye: No discharge.     Conjunctiva/sclera: Conjunctivae normal.  Cardiovascular:     Rate and Rhythm: Regular rhythm. Tachycardia present.     Pulses: Normal pulses.     Heart sounds: Normal heart sounds.  Pulmonary:     Effort: Pulmonary effort is normal.     Breath sounds: Normal breath sounds.  Abdominal:     General: Abdomen is flat.     Palpations: Abdomen is soft.  Skin:    General: Skin is warm and dry.  Neurological:     General: No focal deficit present.  Psychiatric:        Mood and Affect: Mood normal.     ED Results / Procedures / Treatments   Labs (all labs ordered are listed, but only abnormal results are displayed) Labs Reviewed  COMPREHENSIVE METABOLIC PANEL - Abnormal; Notable for the following components:  Result Value   Potassium 3.0 (*)    Glucose, Bld 140 (*)    Total Protein 8.8 (*)    ALT 60 (*)    All other components within normal limits  CBC WITH DIFFERENTIAL/PLATELET - Abnormal; Notable for the following components:   WBC 17.5 (*)    Neutro Abs 13.0 (*)    Monocytes Absolute 1.8 (*)    All other components within normal limits  GROUP A STREP BY PCR    EKG None  Radiology CT Soft Tissue Neck W Contrast  Result Date: 08/11/2022 CLINICAL DATA:  Sore throat, difficulty swallowing EXAM: CT NECK WITH CONTRAST TECHNIQUE: Multidetector CT imaging of the neck was performed using the standard protocol following the bolus administration of intravenous contrast. RADIATION DOSE REDUCTION: This exam was performed according to the departmental dose-optimization program which includes automated exposure control, adjustment of the mA and/or kV according to patient size  and/or use of iterative reconstruction technique. CONTRAST:  76mL OMNIPAQUE IOHEXOL 300 MG/ML  SOLN COMPARISON:  None Available. FINDINGS: Pharynx and larynx: Enlargement of the right-greater-than-left palatine tonsil, with a focal low-density collection at the lateral aspect of the right palatine tonsil, which measures 14 x 15 x 13 mm (AP x TR x CC) (series 6, image 37 and series 4, image 50). Associated edema extends superiorly towards the nasopharynx and inferiorly towards the hypopharynx on the right-greater-than-left. There is also prominence of the lingual and palatine tonsils. The larynx is unremarkable. Salivary glands: No inflammation, mass, or stone. Thyroid: Normal. Lymph nodes: Prominent right-greater-than-left upper cervical lymph nodes, the largest of which is a right level 2A lymph node that measures up to 1.2 cm in short axis. No abnormal density lymph nodes. Vascular: Patent. Limited intracranial: Negative. Visualized orbits: Remote left lamina papyracea fracture. Otherwise negative. Mastoids and visualized paranasal sinuses: Clear. Skeleton: No acute osseous abnormality. Degenerative changes in the cervical spine. Poor dentition with dental caries. Upper chest: No focal pulmonary opacity or pleural effusion. Other: None. IMPRESSION: 1. Right-greater-than-left tonsillitis with right peritonsillar abscess measuring to 15 mm. Associated edema extends throughout the right-greater-than-left aspect of the pharynx. 2. Prominent right-greater-than-left upper cervical lymph nodes, which are likely reactive. Electronically Signed   By: Wiliam Ke M.D.   On: 08/11/2022 23:10    Procedures Procedures    Medications Ordered in ED Medications  labetalol (NORMODYNE) injection 20 mg (has no administration in time range)  potassium chloride 10 mEq in 100 mL IVPB (has no administration in time range)  sodium chloride 0.9 % bolus 1,000 mL (0 mLs Intravenous Stopped 08/11/22 2235)  morphine (PF) 4 MG/ML  injection 4 mg (4 mg Intravenous Given 08/11/22 2158)  ondansetron (ZOFRAN) injection 4 mg (4 mg Intravenous Given 08/11/22 2158)  methylPREDNISolone sodium succinate (SOLU-MEDROL) 125 mg/2 mL injection 125 mg (125 mg Intravenous Given 08/11/22 2158)  Ampicillin-Sulbactam (UNASYN) 3 g in sodium chloride 0.9 % 100 mL IVPB (0 g Intravenous Stopped 08/11/22 2235)  iohexol (OMNIPAQUE) 300 MG/ML solution 75 mL (75 mLs Intravenous Contrast Given 08/11/22 2242)    ED Course/ Medical Decision Making/ A&P                           Medical Decision Making Amount and/or Complexity of Data Reviewed Labs: ordered. Radiology: ordered.  Risk Prescription drug management.   This patient presents to the ED for concern of sore throat, this involves a number of treatment options, and is a complaint that carries  with it a moderate risk of complications and morbidity.  The differential diagnosis includes peritonsillar abscess, retropharyngeal abscess and/or deep space abscess of the neck, and strep pharyngitis.   Co morbidities: Discussed in HPI   EMR reviewed including pt PMHx, past surgical history and past visits to ER.   See HPI for more details   Lab Tests:   I ordered and independently interpreted labs. Labs notable for leukocytosis, hypokalemia   Imaging Studies:  Abnormal findings. I personally reviewed all imaging studies. Imaging notable for 15 mm peritonsillar abscess    Cardiac Monitoring:  The patient was maintained on a cardiac monitor.  I personally viewed and interpreted the cardiac monitored which showed an underlying rhythm of: NSR NA   Medicines ordered:  I ordered medication including Unasyn for peritonsillar abscess, will saline bolus for volume resuscitation.  Solu-Medrol for inflammation, morphine for pain and Zofran for nausea. Reevaluation of the patient after these medicines showed that the patient stayed the same I have reviewed the patients home medicines  and have made adjustments as needed    Consults/Attending Physician   I discussed this case with my attending physician who cosigned this note including patient's presenting symptoms, physical exam, and planned diagnostics and interventions. Attending physician stated agreement with plan or made changes to plan which were implemented.   Reevaluation:  After the interventions noted above I re-evaluated patient and found that they have :stayed the same   Problem List / ED Course: Presented for sore throat.  Exam revealed concern for peritonsillar abscess.  Given the right neck swelling deep space abscess of the neck.  CTA scan was positive for peritonsillar abscess.  Treated with antibiotics, pain control and volume resuscitation.  Patient remains with elevated blood pressure.  Signed out patient to Dr. Estell Harpin who will continue his care.   Dispostion:  No disposition at this time.         Final Clinical Impression(s) / ED Diagnoses Final diagnoses:  Peritonsillar abscess  Hypertension, unspecified type    Rx / DC Orders ED Discharge Orders     None         Gareth Eagle, PA-C 08/11/22 2327    Bethann Berkshire, MD 08/13/22 [phone]

## 2022-08-11 NOTE — ED Triage Notes (Signed)
Pt with sore throat for past 2 days, states difficultly with swallowing since yesterday.  Swelling noted to right side

## 2022-08-11 NOTE — ED Notes (Signed)
Patient transported to CT 

## 2022-08-12 MED ORDER — AMOXICILLIN-POT CLAVULANATE 875-125 MG PO TABS
1.0000 | ORAL_TABLET | Freq: Once | ORAL | Status: AC
  Administered 2022-08-12: 1 via ORAL
  Filled 2022-08-12: qty 1

## 2022-08-12 NOTE — ED Provider Notes (Signed)
Care of the patient assumed at the change of shift. Here for a peritonsillar abscess, has not been able to take BP meds. Awaiting response to labetalol at shift change. He has had good response, he is otherwise asymptomatic with no signs of end organ damage. I spoke with Dr. Pollyann Kennedy, ENT, who agrees with close outpatient follow up in their office in the AM for drainage. Plan discharge from the ED tonight, patient in agreement with plan.    Pollyann Savoy, MD 08/12/22 [phone]

## 2022-08-30 IMAGING — DX DG CHEST 1V PORT
1 series · 1 of 1 positions shown · non-contrast
Comparison: None.

CLINICAL DATA: Chest pain

EXAM:
PORTABLE CHEST 1 VIEW

[chest ap]
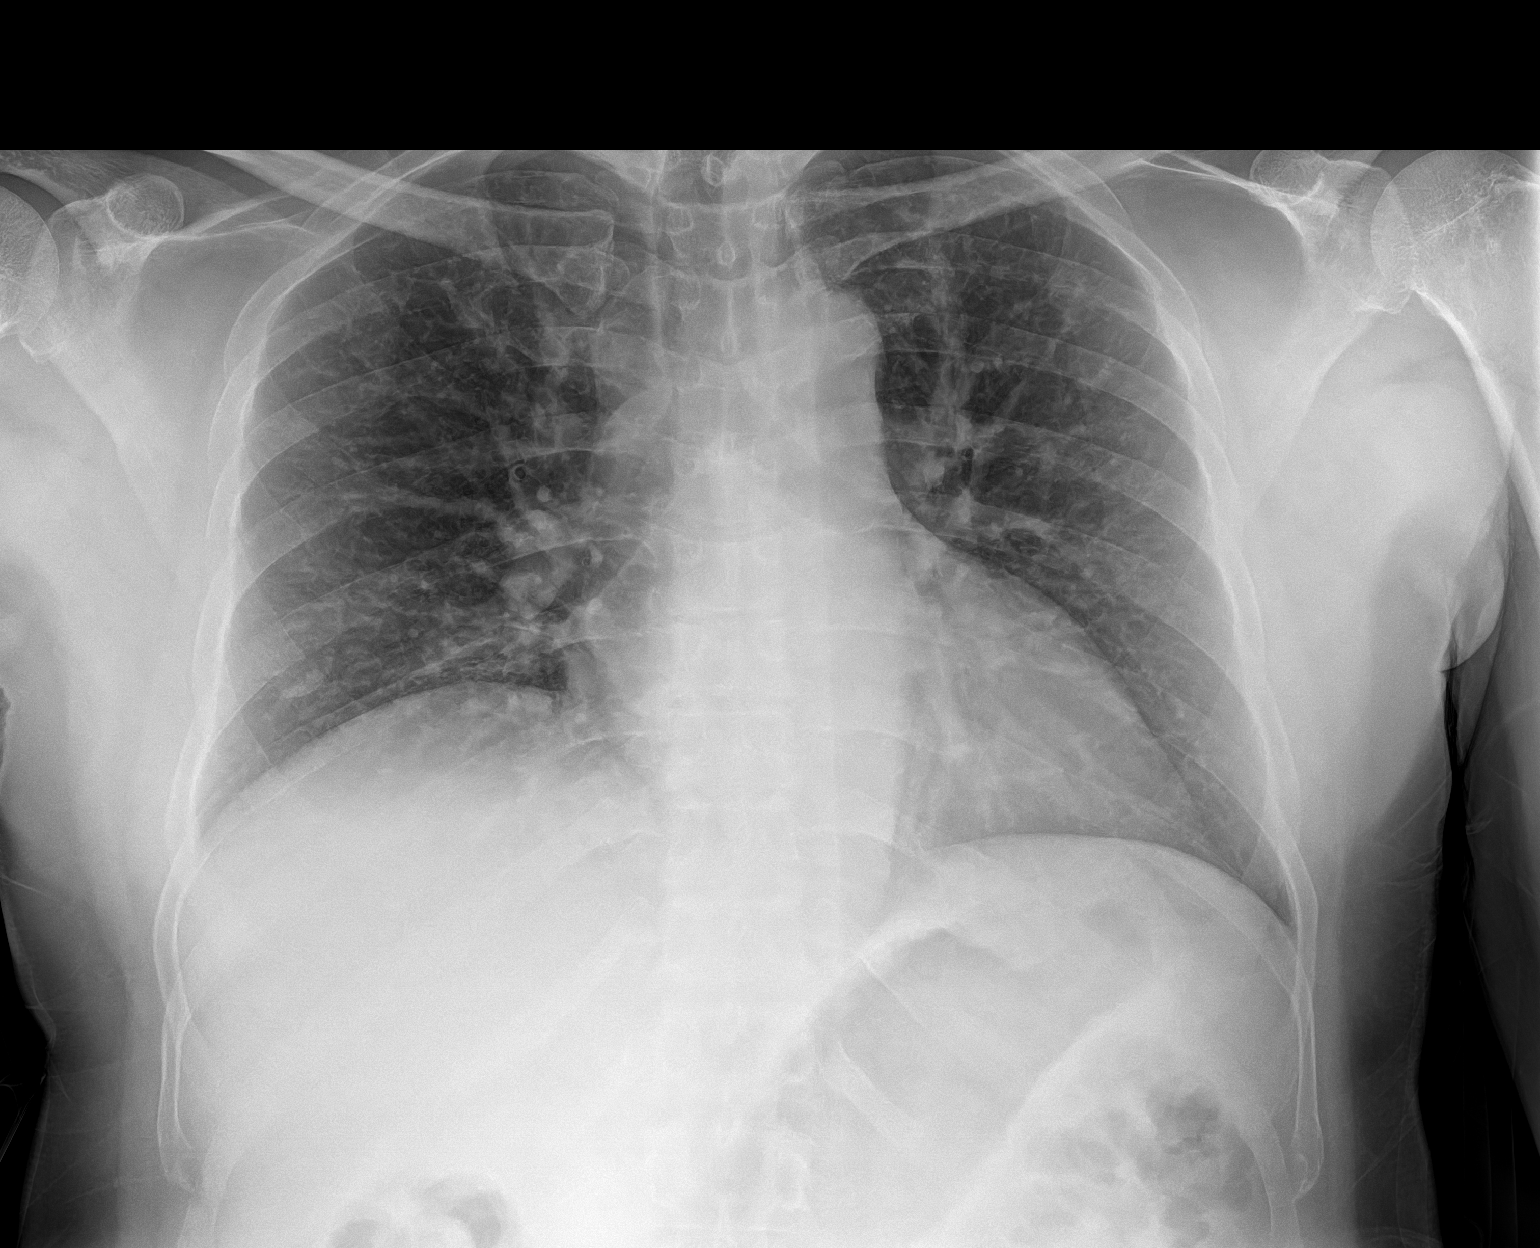

[1 of 1 positions shown; findings below may reference images not displayed]

FINDINGS: 9664 hours. Asymmetric elevation right hemidiaphragm. The lungs are
clear without focal pneumonia, edema, pneumothorax or pleural
effusion. The cardiopericardial silhouette is within normal limits
for size. The visualized bony structures of the thorax show no acute
abnormality.
IMPRESSION: No active disease.
